# Patient Record
Sex: Female | Born: 2005 | Race: Black or African American | Hispanic: No | Marital: Single | Smoking: Never smoker
Health system: Southern US, Community
[De-identification: ages and names within clinical notes are randomized; demographics above are authoritative.]

## PROBLEM LIST (undated history)

## (undated) DIAGNOSIS — J45909 Unspecified asthma, uncomplicated: Secondary | ICD-10-CM

## (undated) DIAGNOSIS — R011 Cardiac murmur, unspecified: Secondary | ICD-10-CM

---
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
## 2024-03-28 IMAGING — US US PELVIS COMPLETE
1 series · 13 of 25 positions shown · non-contrast
Comparison: None Available.

CLINICAL DATA: llq abdominal pain

EXAM:
TRANSABDOMINAL ULTRASOUND OF PELVIS
DOPPLER ULTRASOUND OF OVARIES
TECHNIQUE: Transabdominal ultrasound examination of the pelvis was performed
including evaluation of the uterus, ovaries, adnexal regions, and
pelvic cul-de-sac.
Color and duplex Doppler ultrasound was utilized to evaluate blood
flow to the ovaries.

[Series 1: us pelvis (transabdominal only) · 55 acquisitions, 13 frames shown]
[im 1/55]
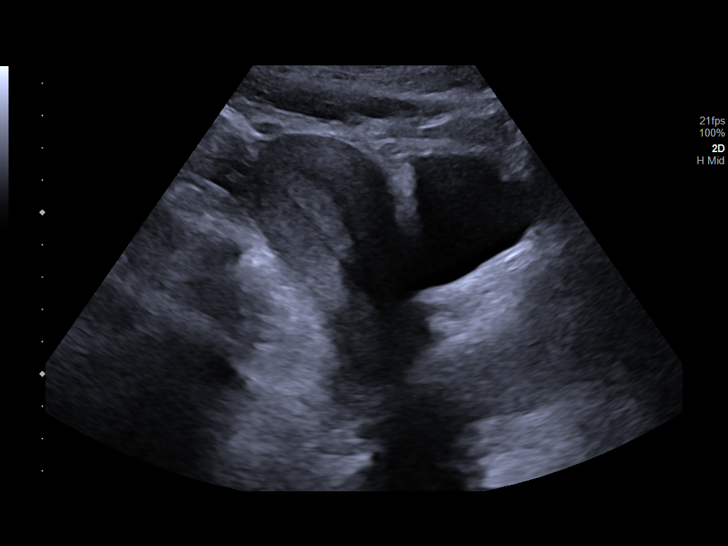
[im 5/55]
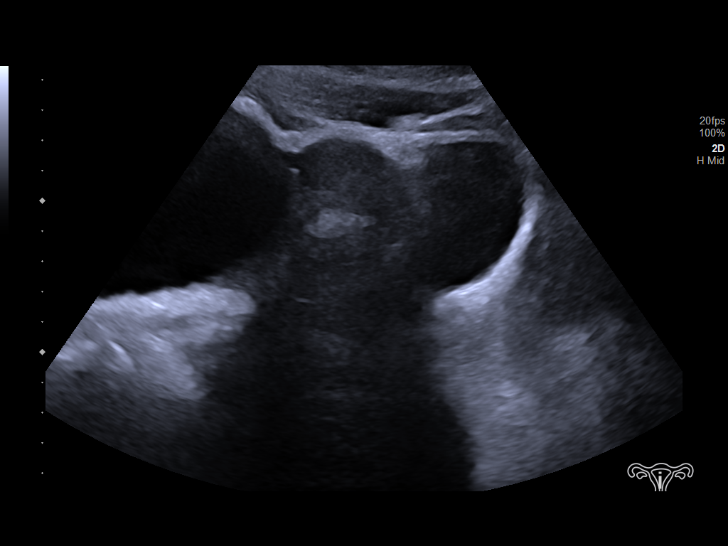
[im 10/55]
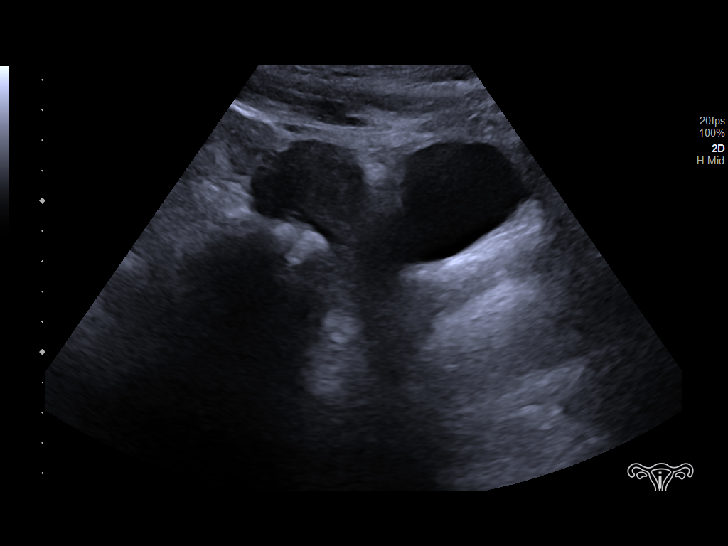
[im 14/55]
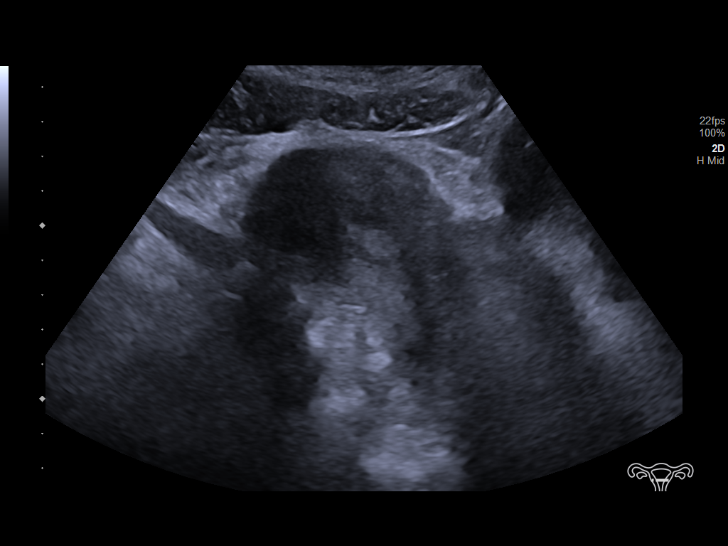
[im 19/55]
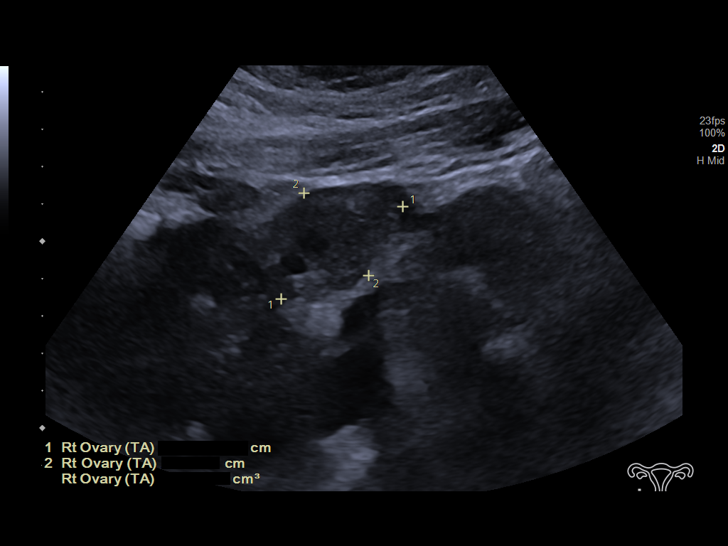
[im 23/55]
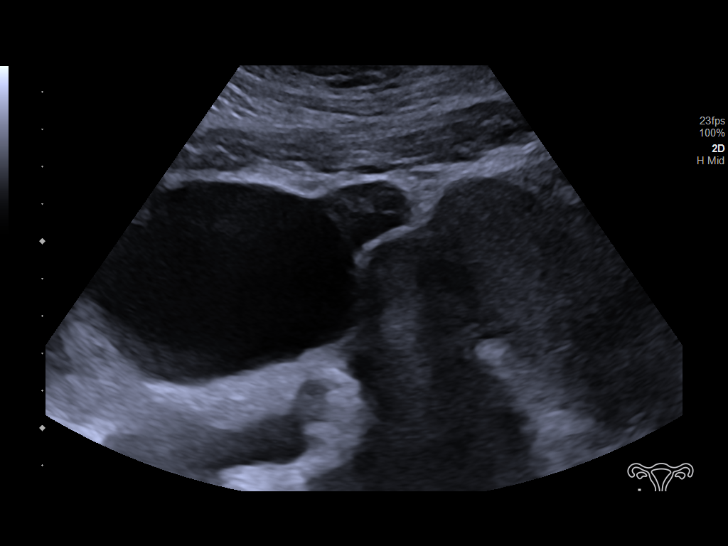
[im 28/55]
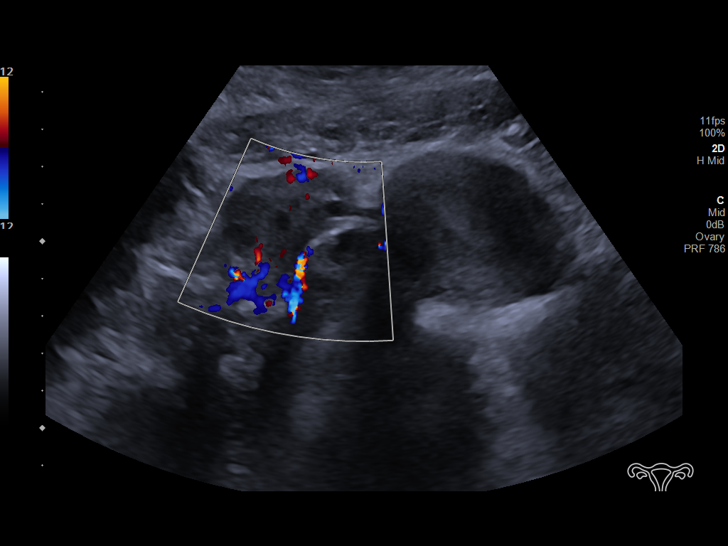
[im 32/55]
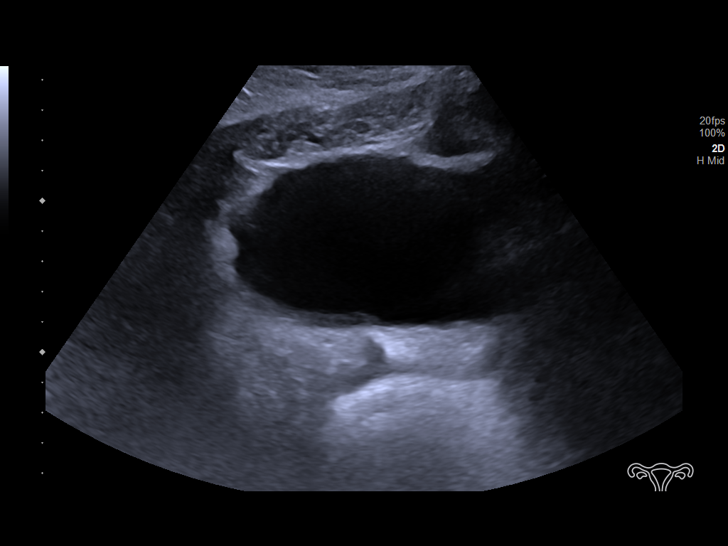
[im 37/55]
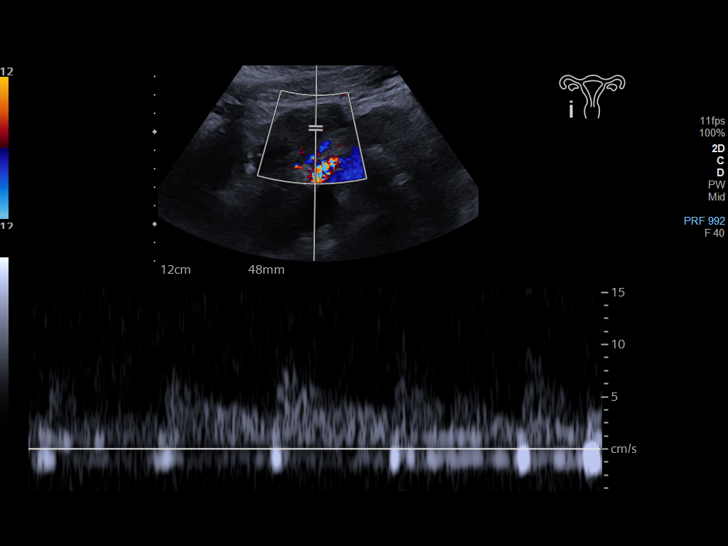
[im 41/55]
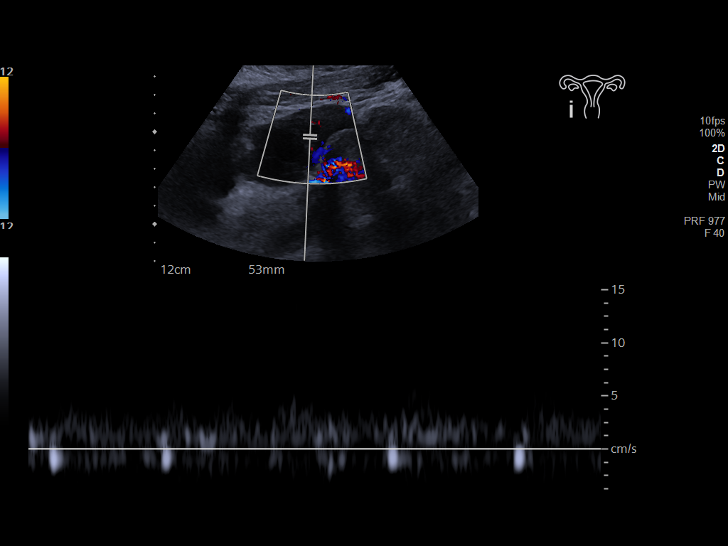
[im 46/55]
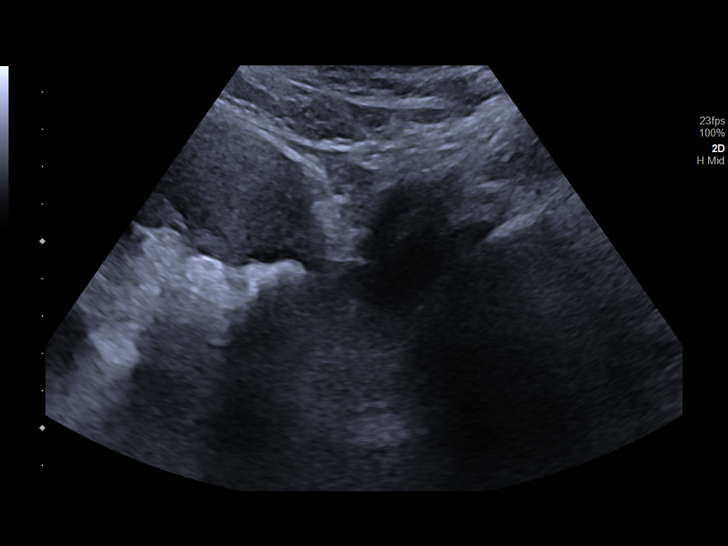
[im 50/55]
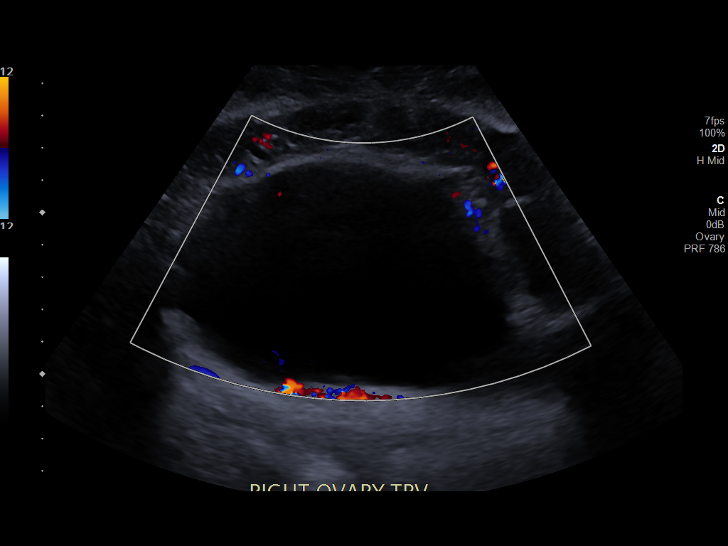
[im 55/55]
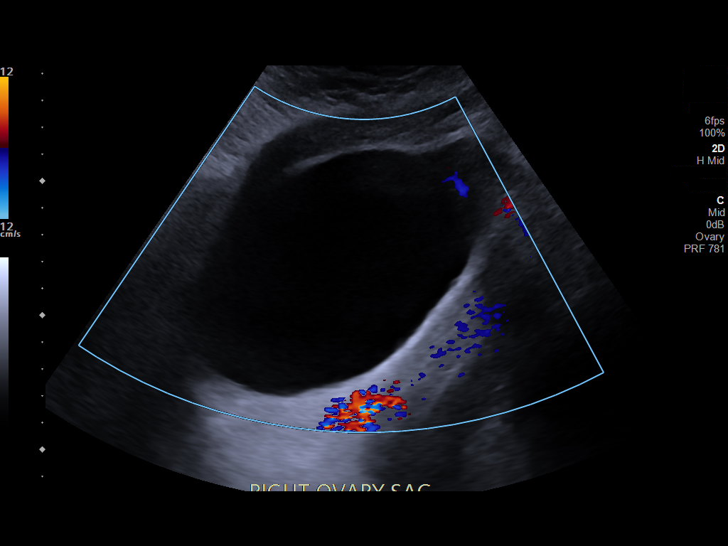

[13 of 25 positions shown; findings below may reference images not displayed]

FINDINGS: Uterus

Measurements: 8.4 x 3.8 x 5.3 cm = volume: 89 mL. No fibroids or
other mass visualized.

Endometrium

Thickness: 7.9 mm.  No focal abnormality visualized.

Right ovary

Measurements: 4.1 x 2.8 x 3.5 cm = volume: 20.8 mL. Normal
appearance/no adnexal mass.

There is large cystic structure subjacent to the right ovary
measuring 11.3 x 12.4 x 7 cm. No solid components seen in the cystic
structure.

Left ovary

Left ovary is not visualized.

Pulsed Doppler evaluation demonstrates normal low-resistance
arterial and venous waveforms in both ovaries.

Other: N/a
IMPRESSION: Uterus and right ovary have a normal appearance. Left ovary is not
visualized. Large cystic structure subjacent to the right ovary
measuring 11.3 x 12.4 x 7 cm.

12.4 cm right ovarian cyst, concern for low-grade cystic
neoplasm.Gynecology consult recommended and consider MR with IV
[DATE]):[phone]

## 2024-04-22 ENCOUNTER — Emergency Department (HOSPITAL_BASED_OUTPATIENT_CLINIC_OR_DEPARTMENT_OTHER)
Admission: EM | Admit: 2024-04-22 | Discharge: 2024-04-22 | Disposition: A | Discharge status: Home / Self Care | Payer: MEDICAID | Attending: Emergency Medicine | Admitting: Emergency Medicine

## 2024-04-22 ENCOUNTER — Other Ambulatory Visit: Payer: Self-pay

## 2024-04-22 ENCOUNTER — Emergency Department (HOSPITAL_BASED_OUTPATIENT_CLINIC_OR_DEPARTMENT_OTHER): Payer: MEDICAID

## 2024-04-22 DIAGNOSIS — W500XXA Accidental hit or strike by another person, initial encounter: Secondary | ICD-10-CM | POA: Diagnosis not present

## 2024-04-22 DIAGNOSIS — Y9372 Activity, wrestling: Secondary | ICD-10-CM | POA: Diagnosis not present

## 2024-04-22 DIAGNOSIS — S0592XA Unspecified injury of left eye and orbit, initial encounter: Secondary | ICD-10-CM | POA: Diagnosis present

## 2024-04-22 LAB — PREGNANCY, URINE: Preg Test, Ur: NEGATIVE

## 2024-04-22 MED ORDER — ONDANSETRON 4 MG PO TBDP
4.0000 mg | ORAL_TABLET | Freq: Once | ORAL | Status: AC
  Administered 2024-04-22: 4 mg via ORAL
  Filled 2024-04-22: qty 1

## 2024-04-22 MED ORDER — TETRACAINE HCL 0.5 % OP SOLN
1.0000 [drp] | Freq: Once | OPHTHALMIC | Status: AC
  Administered 2024-04-22: 1 [drp] via OPHTHALMIC
  Filled 2024-04-22: qty 4

## 2024-04-22 MED ORDER — KETOROLAC TROMETHAMINE 15 MG/ML IJ SOLN
15.0000 mg | Freq: Once | INTRAMUSCULAR | Status: AC
  Administered 2024-04-22: 15 mg via INTRAMUSCULAR
  Filled 2024-04-22: qty 1

## 2024-04-22 MED ORDER — FLUORESCEIN SODIUM 1 MG OP STRP
1.0000 | ORAL_STRIP | Freq: Once | OPHTHALMIC | Status: AC
  Administered 2024-04-22: 1 via OPHTHALMIC
  Filled 2024-04-22: qty 1

## 2024-04-22 NOTE — Discharge Instructions (Addendum)
 You were seen today for injury to your left eye.  Your imaging and physical exam today were reassuring that I have low suspicion for any emergent cause of your symptoms today.  With your vision improving and no other signs of injury at this time, we will have you manage pain with Tylenol  and ibuprofen  at home as well as following up with Dr. Grissom, ophthalmology as needed if you have any persistent blurry vision.  Take Tylenol  (acetominophen)  650mg  every 4-6 hours, as needed for pain or fever. Do not take more than 4,000 mg in a 24-hour period. As this may cause liver damage. While this is rare, if you begin to develop yellowing of the skin or eyes, stop taking and return to ER immediately.  Please return to the ED for any new or worsening symptoms

## 2024-04-22 NOTE — ED Provider Notes (Signed)
 [place] [facility] Provider Note   CSN: [id number] Arrival date & time: 04/22/24  1538     History  Chief Complaint  Patient presents with   Dizziness    Lisa Marks is a 18 y.o. female.   Dizziness  Patient is an 18 year old female presents ED today with left eye injury after she was reportedly wrestling with her brother was hit in her left orbit with an elbow.  States that since then she has felt nauseous, has had a headache, and has had blurry vision in the left eye. Endorses photophobia in the left eye.  Denies floaters, visual flashes, vertigo, chest pain, shortness of breath, neck pain, vomiting, abdominal pain.     Home Medications Prior to Admission medications   Medication Sig Start Date End Date Taking? Authorizing Provider  albuterol  (PROVENTIL  HFA;VENTOLIN  HFA) 108 (90 BASE) MCG/ACT inhaler Inhale 1-2 puffs into the lungs every 6 (six) hours as needed for wheezing or shortness of breath.    [provider]  beclomethasone (QVAR) 40 MCG/ACT inhaler Inhale 2 puffs into the lungs 2 (two) times daily.    [provider]  benzonatate  (TESSALON ) 100 MG capsule Take 1 capsule (100 mg total) by mouth every 8 (eight) hours. 12/01/23   Imogene Mana, NP  Ferrous Sulfate (IRON PO) Take by mouth.    [provider]  norethindrone -ethinyl estradiol (LOESTRIN 1/20, 21,) 1-20 MG-MCG tablet Take 1 tablet by mouth daily. 04/08/22   Garen Juneau, NP  ondansetron  (ZOFRAN -ODT) 4 MG disintegrating tablet Take 1 tablet (4 mg total) by mouth every 8 (eight) hours as needed. 04/08/22   Garen Juneau, NP  polyethylene glycol powder (GLYCOLAX /MIRALAX ) 17 GM/SCOOP powder Take 17 g by mouth daily. 04/08/22   Garen Juneau, NP  promethazine -dextromethorphan (PROMETHAZINE -DM) 6.25-15 MG/5ML syrup Take 5 mLs by mouth 4 (four) times daily as needed for cough. 12/01/23   Imogene Mana, NP  cetirizine  (ZYRTEC ) 1 MG/ML syrup Take 10 mLs  (10 mg total) by mouth daily. 03/27/15 05/19/19  Dalene Duck, MD  montelukast (SINGULAIR) 4 MG chewable tablet Chew 4 mg by mouth every morning.  05/19/19  [provider]      Allergies    Pork-derived products and Shellfish allergy    Review of Systems   Review of Systems  Eyes:  Positive for photophobia and pain.  Neurological:  Positive for dizziness.  All other systems reviewed and are negative.   Physical Exam Updated Vital Signs BP (!) 141/95 (BP Location: Left Arm)   Pulse 86   Temp 98.7 F (37.1 C) (Oral)   Resp 19   Ht 5\' 3"  (1.6 m)   SpO2 99%   BMI 43.22 kg/m  Physical Exam Vitals and nursing note reviewed.  Constitutional:      General: She is not in acute distress.    Appearance: Normal appearance. She is not ill-appearing or diaphoretic.  HENT:     Head: Normocephalic.     Comments: Patient notably tender over the left aspect of her forehead.  No swelling, erythema, bruising noted. Eyes:     General: Lids are normal. Lids are everted, no foreign bodies appreciated. Vision grossly intact. Gaze aligned appropriately. No scleral icterus.       Right eye: No discharge.        Left eye: No discharge.     Intraocular pressure: Left eye pressure is 18 mmHg.     Extraocular Movements: Extraocular movements intact.  Right eye: No nystagmus.     Left eye: No nystagmus.     Conjunctiva/sclera: Conjunctivae normal.     Right eye: Right conjunctiva is not injected. No chemosis, exudate or hemorrhage.    Left eye: Left conjunctiva is not injected. No chemosis, exudate or hemorrhage.    Pupils: Pupils are equal, round, and reactive to light.     Right eye: Pupil is round, reactive and not sluggish. No corneal abrasion.     Left eye: Pupil is round, reactive and not sluggish. No corneal abrasion or fluorescein uptake. Seidel exam negative.    Comments: Patient has no blurry vision in left eye, denies diplopia.  Patient notably has pain with medial EOM in  left eye but all EOMs intact.  Cardiovascular:     Rate and Rhythm: Normal rate and regular rhythm.     Pulses: Normal pulses.     Heart sounds: Normal heart sounds. No murmur heard.    No friction rub. No gallop.  Pulmonary:     Effort: Pulmonary effort is normal. No respiratory distress.     Breath sounds: Normal breath sounds.  Chest:     Chest wall: No tenderness.  Abdominal:     General: Abdomen is flat. There is no distension.     Palpations: Abdomen is soft.     Tenderness: There is no abdominal tenderness. There is no guarding.  Musculoskeletal:     Cervical back: Normal range of motion and neck supple. No rigidity or tenderness.  Skin:    General: Skin is warm and dry.     Findings: No bruising, erythema or lesion.  Neurological:     General: No focal deficit present.     Mental Status: She is alert and oriented to person, place, and time. Mental status is at baseline.     Sensory: No sensory deficit.     Motor: No weakness.     Gait: Gait normal.  Psychiatric:        Mood and Affect: Mood normal.     ED Results / Procedures / Treatments   Labs (all labs ordered are listed, but only abnormal results are displayed) Labs Reviewed  PREGNANCY, URINE    EKG EKG Interpretation Date/Time:  Friday April 22 2024 15:46:24 EDT Ventricular Rate:  86 PR Interval:  165 QRS Duration:  80 QT Interval:  368 QTC Calculation: 441 R Axis:   -5  Text Interpretation: Sinus rhythm since last tracing no significant change Confirmed by Hershel Los [phone]) on 04/22/2024 3:50:57 PM  Radiology CT Orbits Wo Contrast Result Date: 04/22/2024 CLINICAL DATA:  Left eye injury, pain EXAM: CT ORBITS WITHOUT CONTRAST TECHNIQUE: Multidetector CT imaging of the orbits was performed using the standard protocol without intravenous contrast. Multiplanar CT image reconstructions were also generated. RADIATION DOSE REDUCTION: This exam was performed according to the departmental dose-optimization  program which includes automated exposure control, adjustment of the mA and/or kV according to patient size and/or use of iterative reconstruction technique. COMPARISON:  None Available. FINDINGS: Orbits:  No fracture or orbital emphysema.  Globes are intact. Visible paranasal sinuses: Clear Soft tissues: Soft tissue swelling over the left orbit Osseous: No fracture or aggressive lesion. Limited intracranial: No acute or significant finding. IMPRESSION: No visible orbital or facial fracture. Electronically Signed   By: Janeece Mechanic M.D.   On: 04/22/2024 17:17    Procedures Procedures    Medications Ordered in ED Medications  fluorescein ophthalmic strip 1 strip (1 strip Left Eye  Given 04/22/24 1633)  ondansetron  (ZOFRAN -ODT) disintegrating tablet 4 mg (4 mg Oral Given 04/22/24 1633)  tetracaine (PONTOCAINE) 0.5 % ophthalmic solution 1 drop (1 drop Left Eye Given by Other 04/22/24 1819)  ketorolac (TORADOL) 15 MG/ML injection 15 mg (15 mg Intramuscular Given 04/22/24 1820)    ED Course/ Medical Decision Making/ A&P                                 Medical Decision Making Amount and/or Complexity of Data Reviewed Labs: ordered. Radiology: ordered.  Risk Prescription drug management.   This patient is a 18 year old female who presents to the ED for concern of left eye pain, photophobia, blurry vision, headache, nausea post getting hit with an elbow to left orbit today.   On physical exam, patient is in no acute distress, afebrile, alert and orient x 4, speaking in full sentences, nontachypneic, nontachycardic.  No foreign bodies noted, no erythema, injection, chemosis noted, EOMs intact, pupils PERRL with no signs of traumatic hyphema.  Does note pain with EOMs.  Negative Seidel sign, no corneal abrasion noted on fluorescein, no hyphema noted, visual acuity is 20/25 bilaterally and in each eye separately.  Pressures 18 in left eye.  Prior Toradol, patient states that her symptoms have improved,  notes that her vision is also returning.  Alerted her that we can call ophthalmology to consult them if she is willing to wait, she says that she is going to go home at this point and will follow ophthalmology as needed.  Will have her continue to manage pain with Tylenol  at home.  And return to the ED for any new or worsening symptoms.  CT scan was also negative for fracture.  Will have patient follow-up with ophthalmology as needed per her request if she begins to have any persistent or worsening vision.  Patient vital signs have remained stable throughout the course of patient's time in the ED. Low suspicion for any other emergent pathology at this time. I believe this patient is safe to be discharged. Provided strict return to ER precautions. Patient expressed agreement and understanding of plan. All questions were answered.  Differential diagnoses prior to evaluation: The emergent differential diagnosis includes, but is not limited to, globe rupture, globe fracture, orbital fracture, corneal abrasion, traumatic iritis, hyphema, periorbital contusion, subconjunctival hemorrhage, retinal detachment, intracranial hemorrhage. This is not an exhaustive differential.   Past Medical History / Co-morbidities / Social History: Asthma  Additional history: Chart reviewed.   Lab Tests/Imaging studies: I personally interpreted labs/imaging and the pertinent results include:   CT scan unremarkable Pregnancy is negative. I agree with the radiologist interpretation.  Cardiac monitoring: EKG obtained and interpreted by myself and attending physician which shows: Sinus rhythm   EKG Interpretation Date/Time:  Friday April 22 2024 15:46:24 EDT Ventricular Rate:  86 PR Interval:  165 QRS Duration:  80 QT Interval:  368 QTC Calculation: 441 R Axis:   -5  Text Interpretation: Sinus rhythm since last tracing no significant change Confirmed by Hershel Los [phone]) on 04/22/2024 3:50:57 PM           Medications: I ordered medication including Toradol.  I have reviewed the patients home medicines and have made adjustments as needed.  Social Determinants of Health: None  Disposition: After consideration of the diagnostic results and the patients response to treatment, I feel that the patient would benefit from discharge and treatment as above.  emergency department workup does not suggest an emergent condition requiring admission or immediate intervention beyond what has been performed at this time. The plan is: Follow-up with ophthalmology as needed for any returning or blurry vision, return to the ED for new or worsening symptoms, symptomatic management at home. The patient is safe for discharge and has been instructed to return immediately for worsening symptoms, change in symptoms or any other concerns.   Final Clinical Impression(s) / ED Diagnoses Final diagnoses:  Left eye injury, initial encounter    Rx / DC Orders ED Discharge Orders     None         Hayes Lipps, PA-C 04/22/24 1829    Hershel Los, MD 04/22/24 2039

## 2024-04-22 NOTE — ED Notes (Signed)
 Patient requesting warm blanket, blanket give.

## 2024-04-22 NOTE — ED Notes (Signed)
 Visual acuity completed per order. Pt does not wear glasses but says "I need to due to astigmatism in both eyes. Patient states vision is blurry in left eye.

## 2024-04-22 NOTE — ED Triage Notes (Signed)
 Pt was playing around with friend when friend accidentally hit pt in/around her her left eye with elbow. Pt states she was hit really hard. Pt complaining of dizziness and being tired. Endorses slight blurry vision in left eye.

## 2024-04-25 ENCOUNTER — Emergency Department (HOSPITAL_COMMUNITY)
Admission: EM | Admit: 2024-04-25 | Discharge: 2024-04-25 | Disposition: A | Discharge status: Home / Self Care | Payer: MEDICAID | Attending: Emergency Medicine | Admitting: Emergency Medicine

## 2024-04-25 ENCOUNTER — Emergency Department (HOSPITAL_COMMUNITY): Payer: MEDICAID

## 2024-04-25 DIAGNOSIS — X501XXA Overexertion from prolonged static or awkward postures, initial encounter: Secondary | ICD-10-CM | POA: Insufficient documentation

## 2024-04-25 DIAGNOSIS — Y9301 Activity, walking, marching and hiking: Secondary | ICD-10-CM | POA: Insufficient documentation

## 2024-04-25 DIAGNOSIS — S93401A Sprain of unspecified ligament of right ankle, initial encounter: Secondary | ICD-10-CM

## 2024-04-25 DIAGNOSIS — S99911A Unspecified injury of right ankle, initial encounter: Secondary | ICD-10-CM | POA: Diagnosis present

## 2024-04-25 DIAGNOSIS — S93491A Sprain of other ligament of right ankle, initial encounter: Secondary | ICD-10-CM | POA: Diagnosis not present

## 2024-04-25 MED ORDER — NAPROXEN 375 MG PO TABS: 375.0000 mg | ORAL_TABLET | Freq: Two times a day (BID) | ORAL | 0 refills | Status: AC

## 2024-04-25 NOTE — ED Provider Notes (Signed)
 [facility] EMERGENCY DEPARTMENT AT [facility] Provider Note   CSN: [id number] Arrival date & time: 04/25/24  1539     History  No chief complaint on file.   Lisa Marks is a 18 y.o. female  S: Lisa Marks is a 18 y.o. female who complains of inversion injury to the right ankle 2 days ago. There is pain and swelling at the lateral aspect of that ankle. The patient has been able to bear weight with significant pain. No umbness tingling or hx of previous injury   HPI     Home Medications Prior to Admission medications   Medication Sig Start Date End Date Taking? Authorizing Provider  naproxen  (NAPROSYN ) 375 MG tablet Take 1 tablet (375 mg total) by mouth 2 (two) times daily with a meal. 04/25/24  Yes Lisa Larocca, PA-C  albuterol  (PROVENTIL  HFA;VENTOLIN  HFA) 108 (90 BASE) MCG/ACT inhaler Inhale 1-2 puffs into the lungs every 6 (six) hours as needed for wheezing or shortness of breath.    [provider]  beclomethasone (QVAR) 40 MCG/ACT inhaler Inhale 2 puffs into the lungs 2 (two) times daily.    [provider]  benzonatate  (TESSALON ) 100 MG capsule Take 1 capsule (100 mg total) by mouth every 8 (eight) hours. 12/01/23   Lisa Mana, NP  Ferrous Sulfate (IRON PO) Take by mouth.    [provider]  norethindrone -ethinyl estradiol (LOESTRIN 1/20, 21,) 1-20 MG-MCG tablet Take 1 tablet by mouth daily. 04/08/22   Lisa Juneau, NP  ondansetron  (ZOFRAN -ODT) 4 MG disintegrating tablet Take 1 tablet (4 mg total) by mouth every 8 (eight) hours as needed. 04/08/22   Lisa Juneau, NP  polyethylene glycol powder (GLYCOLAX /MIRALAX ) 17 GM/SCOOP powder Take 17 g by mouth daily. 04/08/22   Lisa Juneau, NP  promethazine -dextromethorphan (PROMETHAZINE -DM) 6.25-15 MG/5ML syrup Take 5 mLs by mouth 4 (four) times daily as needed for cough. 12/01/23   Lisa Mana, NP  cetirizine  (ZYRTEC ) 1 MG/ML syrup Take 10 mLs (10 mg total) by mouth daily. 03/27/15 05/19/19   Lisa Duck, MD  montelukast (SINGULAIR) 4 MG chewable tablet Chew 4 mg by mouth every morning.  05/19/19  [provider]      Allergies    Pork-derived products and Shellfish allergy    Review of Systems   Review of Systems  Physical Exam Updated Vital Signs BP (!) 133/97 (BP Location: Left Arm)   Pulse (!) 107   Temp 98.1 F (36.7 C) (Oral)   Resp 18   Ht 5' 3 (1.6 m)   Wt 117.9 kg   SpO2 100%   BMI 46.06 kg/m  Physical Exam Constitutional:      Appearance: She is obese.  HENT:     Head: Normocephalic and atraumatic.     Mouth/Throat:     Mouth: Mucous membranes are moist.  Eyes:     Pupils: Pupils are equal, round, and reactive to light.  Pulmonary:     Effort: Pulmonary effort is normal.     Breath sounds: Normal breath sounds.  Musculoskeletal:     Comments: Exam of the injured ankle reveals swelling and tenderness over the lateral malleolus. No tenderness over the medial aspect of the ankle. The fifth metatarsal is not tender. The ankle joint is intact without excessive opening on stressing. X-Ray shows fracture to be negative. The rest of the foot, ankle and leg exam is normal.      ED Results / Procedures / Treatments  Labs (all labs ordered are listed, but only abnormal results are displayed) Labs Reviewed - No data to display  EKG None  Radiology DG Ankle Complete Right Result Date: 04/25/2024 CLINICAL DATA:  Two day history of right ankle pain after inversion injury EXAM: RIGHT ANKLE - COMPLETE 3 VIEW COMPARISON:  None Available. FINDINGS: There are no findings of fracture or dislocation. Small joint effusion. There is no evidence of arthropathy or other focal bone abnormality. Ankle mortise is intact. Soft tissue swelling over the lateral malleolus. IMPRESSION: Soft tissue swelling over the lateral malleolus without evidence of fracture or dislocation. Small ankle joint effusion. Electronically Signed   By: Limin  Xu M.D.   On: 04/25/2024  16:48    Procedures Procedures    Medications Ordered in ED Medications - No data to display  ED Course/ Medical Decision Making/ A&P                                 Medical Decision Making Lisa Marks is a 18 y.o. female who presents to ED for R ankle pain after mechanical fall. RLE NVI. Exam c/w ankle sprain. X-ray negative for acute fracture or dislocation. ASO brace and crutches provided in ED. Home care instructions including RICE and NSAID's discussed. Follow up with ortho if symptoms not improving in 1 week. All questions answered.    Amount and/or Complexity of Data Reviewed Radiology: ordered and independent interpretation performed.    Details: I personally visualized and interpreted the images using our PACS system. Acute findings include:  none   Risk Prescription drug management.           Final Clinical Impression(s) / ED Diagnoses Final diagnoses:  Moderate ankle sprain, right, initial encounter    Rx / DC Orders ED Discharge Orders          Ordered    naproxen  (NAPROSYN ) 375 MG tablet  2 times daily with meals        04/25/24 1700              Lisa Fails, PA-C 04/25/24 1728    Lisa Currier, MD 04/29/24 2308

## 2024-04-25 NOTE — ED Triage Notes (Signed)
 Patient complains of right ankle pain x 2 days. States she was walking down incline and foot twisted and she heard a cracking sounds, pain has gotten worse. States it has been swollen. No deformity observed at triage, swelling to left ankle ankle, good pedal pulses. Rates pain 6/10, worse when ambulating.

## 2024-04-25 NOTE — Discharge Instructions (Signed)
 General instructions Take over-the-counter and prescription medicines only as told by your provider. Do not use any products that contain nicotine or tobacco. These products include cigarettes, chewing tobacco, and vaping devices, such as e-cigarettes. If you need help quitting, ask your provider. Rest your ankle. Do not use your ankle to support your body weight until your provider says that you can. Use crutches as told by your provider. Ask your provider when it is safe to drive if you have a brace or splint on your ankle. Contact a health care provider if: You have bruising or swelling that get worse all of a sudden. Your pain does not get better with medicine. Get help right away if: Your foot or toes become numb or blue. You have severe pain that gets worse.

## 2024-04-30 ENCOUNTER — Emergency Department (HOSPITAL_COMMUNITY): Payer: MEDICAID

## 2024-04-30 ENCOUNTER — Other Ambulatory Visit: Payer: Self-pay

## 2024-04-30 ENCOUNTER — Emergency Department (HOSPITAL_COMMUNITY)
Admission: EM | Admit: 2024-04-30 | Discharge: 2024-04-30 | Disposition: A | Discharge status: Home / Self Care | Payer: MEDICAID | Attending: Emergency Medicine | Admitting: Emergency Medicine

## 2024-04-30 ENCOUNTER — Encounter (HOSPITAL_COMMUNITY): Payer: Self-pay | Admitting: *Deleted

## 2024-04-30 DIAGNOSIS — S93402A Sprain of unspecified ligament of left ankle, initial encounter: Secondary | ICD-10-CM | POA: Diagnosis not present

## 2024-04-30 DIAGNOSIS — X501XXA Overexertion from prolonged static or awkward postures, initial encounter: Secondary | ICD-10-CM | POA: Insufficient documentation

## 2024-04-30 DIAGNOSIS — M25572 Pain in left ankle and joints of left foot: Secondary | ICD-10-CM | POA: Diagnosis present

## 2024-04-30 MED ORDER — ACETAMINOPHEN 325 MG PO TABS
650.0000 mg | ORAL_TABLET | Freq: Once | ORAL | Status: AC
  Administered 2024-04-30: 650 mg via ORAL
  Filled 2024-04-30: qty 2

## 2024-04-30 NOTE — Discharge Instructions (Signed)
 Recommend 1000 mg of Tylenol  every 6 hours as needed for pain.  Recommend 800 mg ibuprofen  every 8 hours as needed for pain.  Use walking boot and crutches for comfort.  Activity as tolerated.  Recommend ice and elevation of the foot when possible.

## 2024-04-30 NOTE — ED Notes (Signed)
 Ortho tech called for placement of ordered CAM boot. Ortho tech states will be on the way shortly.

## 2024-04-30 NOTE — ED Provider Notes (Signed)
  EMERGENCY DEPARTMENT AT [facility] Provider Note   CSN: [id number] Arrival date & time: 04/30/24  1856     Patient presents with: Ankle Pain   Lisa Marks is a 18 y.o. female.   Patient here with left ankle pain after twisting her ankle earlier today.  Denies any numbness weakness tingling.  She has had some swelling.  Pain mostly to the medial portion of her ankle.  No pain elsewhere.  Did not hit her head or lose consciousness.  The history is provided by the patient.       Prior to Admission medications   Medication Sig Start Date End Date Taking? Authorizing Provider  albuterol  (PROVENTIL  HFA;VENTOLIN  HFA) 108 (90 BASE) MCG/ACT inhaler Inhale 1-2 puffs into the lungs every 6 (six) hours as needed for wheezing or shortness of breath.    [provider]  beclomethasone (QVAR) 40 MCG/ACT inhaler Inhale 2 puffs into the lungs 2 (two) times daily.    [provider]  benzonatate  (TESSALON ) 100 MG capsule Take 1 capsule (100 mg total) by mouth every 8 (eight) hours. 12/01/23   Imogene Mana, NP  Ferrous Sulfate (IRON PO) Take by mouth.    [provider]  naproxen  (NAPROSYN ) 375 MG tablet Take 1 tablet (375 mg total) by mouth 2 (two) times daily with a meal. 04/25/24   Tama Fails, PA-C  norethindrone -ethinyl estradiol (LOESTRIN 1/20, 21,) 1-20 MG-MCG tablet Take 1 tablet by mouth daily. 04/08/22   Garen Juneau, NP  ondansetron  (ZOFRAN -ODT) 4 MG disintegrating tablet Take 1 tablet (4 mg total) by mouth every 8 (eight) hours as needed. 04/08/22   Garen Juneau, NP  polyethylene glycol powder (GLYCOLAX /MIRALAX ) 17 GM/SCOOP powder Take 17 g by mouth daily. 04/08/22   Garen Juneau, NP  promethazine -dextromethorphan (PROMETHAZINE -DM) 6.25-15 MG/5ML syrup Take 5 mLs by mouth 4 (four) times daily as needed for cough. 12/01/23   Imogene Mana, NP  cetirizine  (ZYRTEC ) 1 MG/ML syrup Take 10 mLs (10 mg total) by mouth daily. 03/27/15 05/19/19  Dalene Duck, MD  montelukast (SINGULAIR) 4 MG chewable tablet Chew 4 mg by mouth every morning.  05/19/19  [provider]    Allergies: Pork-derived products and Shellfish allergy    Review of Systems  Updated Vital Signs BP 125/63 (BP Location: Left Arm)   Pulse 82   Temp 99.3 F (37.4 C)   Resp 18   Ht 5' 3 (1.6 m)   Wt 117.9 kg   SpO2 96%   BMI 46.04 kg/m   Physical Exam Constitutional:      General: She is not in acute distress.    Appearance: She is not ill-appearing.   Cardiovascular:     Pulses: Normal pulses.     Heart sounds: Normal heart sounds.   Musculoskeletal:        General: Swelling and tenderness present. Normal range of motion.     Cervical back: Normal range of motion.     Comments: Tenderness to the left ankle with mild swelling no obvious deformity   Skin:    General: Skin is warm.   Neurological:     General: No focal deficit present.     Mental Status: She is alert.     Sensory: No sensory deficit.     Motor: No weakness.     (all labs ordered are listed, but only abnormal results are displayed) Labs Reviewed - No data to display  EKG: None  Radiology:  DG Ankle Complete Left Result Date: 04/30/2024 EXAM: 3 VIEW(S) XRAY OF THE LEFT ANKLE 04/30/2024 07:47:00 PM CLINICAL HISTORY: The patient twisted her left ankle earlier today. Twisted left ankle; lateral swelling. COMPARISON: None available. FINDINGS: BONES AND JOINTS: No acute fracture. No focal osseous lesion. No joint dislocation. SOFT TISSUES: The soft tissues are unremarkable. IMPRESSION: 1. No acute osseous abnormality. Electronically signed by: Zadie Herter MD 04/30/2024 07:52 PM EDT RP Workstation: [id number]     Procedures   Medications Ordered in the ED  acetaminophen  (TYLENOL ) tablet 650 mg (650 mg Oral Given 04/30/24 2049)                                    Medical Decision Making Risk OTC drugs.   Leary Provencal is here with left ankle pain after  twisting her ankle.  Normal vitals.  No fever.  Differential diagnosis fracture versus sprain.  X-ray showed no fracture or dislocation per my review interpretation.  She is neurovascular neuromuscular intact on exam.  Recommend Tylenol  ice ibuprofen  and rest.  Given walking boot for comfort.  She has crutches at home.  She has no pain elsewhere.  Talked about elevating her leg and icing as well.  Will have her follow-up with orthopedics.  Discharged in good condition.  Tylenol  and ice provided in the ED.  This chart was dictated using voice recognition software.  Despite best efforts to proofread,  errors can occur which can change the documentation meaning.      Final diagnoses:  Sprain of left ankle, unspecified ligament, initial encounter    ED Discharge Orders     None          Lowery Rue, DO 04/30/24 2140

## 2024-04-30 NOTE — ED Notes (Addendum)
 Ice pack placed on ankle. Pt verbally upset that pain medications have not been given at this time. Patient informed that pain medications would have to be put in by the doctor as an order (patient seen by EDP a few minutes before). Once said medications have been ordered, they will be given. Pt verbalized understanding.

## 2024-04-30 NOTE — ED Notes (Signed)
 Ortho tech at bedside

## 2024-04-30 NOTE — ED Notes (Signed)
 EDP provider at bedside educating patient on importance of use of crutches and keeping extremity elevated. Pt states she has crutches at home.

## 2024-04-30 NOTE — ED Notes (Signed)
 Ortho tech reports to this RN patient refusing crutches at this time. EDP made aware.

## 2024-04-30 NOTE — Progress Notes (Signed)
 Orthopedic Tech Progress Note Patient Details:  Lisa Marks 06-07-2006 [id number]  Ortho Devices Type of Ortho Device: CAM walker Ortho Device/Splint Location: lle Ortho Device/Splint Interventions: Ordered, Application, Adjustment  The pt refused crutches when I told them that they would need to get up and show me they could us  them properly. Post Interventions Patient Tolerated: Well Instructions Provided: Care of device, Adjustment of device  Terryann Fiddler 04/30/2024, 9:20 PM

## 2024-04-30 NOTE — ED Triage Notes (Signed)
 The pt is c/o lt ankle pain 1400 today she twisted the ankle and fell  lmp none bc

## 2024-05-15 ENCOUNTER — Other Ambulatory Visit: Payer: Self-pay

## 2024-05-15 ENCOUNTER — Encounter (HOSPITAL_COMMUNITY): Payer: Self-pay | Admitting: *Deleted

## 2024-05-15 ENCOUNTER — Emergency Department (HOSPITAL_COMMUNITY)
Admission: EM | Admit: 2024-05-15 | Discharge: 2024-05-16 | Disposition: A | Discharge status: Home / Self Care | Payer: MEDICAID | Attending: Emergency Medicine | Admitting: Emergency Medicine

## 2024-05-15 ENCOUNTER — Emergency Department (HOSPITAL_COMMUNITY): Payer: MEDICAID

## 2024-05-15 DIAGNOSIS — X501XXA Overexertion from prolonged static or awkward postures, initial encounter: Secondary | ICD-10-CM | POA: Diagnosis not present

## 2024-05-15 DIAGNOSIS — M25571 Pain in right ankle and joints of right foot: Secondary | ICD-10-CM | POA: Insufficient documentation

## 2024-05-15 NOTE — ED Triage Notes (Signed)
 Pt sprained her left ankle 2 weeks ago and placed in a cam walker. Reports ongoing pain and discomfort to ankle since injury. Swelling noted. Denies new injury

## 2024-05-16 MED ORDER — NAPROXEN 375 MG PO TABS: 375.0000 mg | ORAL_TABLET | Freq: Two times a day (BID) | ORAL | 0 refills | Status: AC

## 2024-05-16 MED ORDER — NAPROXEN 250 MG PO TABS
500.0000 mg | ORAL_TABLET | Freq: Once | ORAL | Status: AC
  Administered 2024-05-16: 500 mg via ORAL
  Filled 2024-05-16: qty 2

## 2024-05-16 NOTE — ED Provider Notes (Signed)
  EMERGENCY DEPARTMENT AT [facility] Provider Note   CSN: [id number] Arrival date & time: 05/15/24  8090     Patient presents with: Ankle Pain   Lisa Marks is a 18 y.o. female.   The history is provided by the patient.  Ankle Pain Location:  Ankle Time since incident:  3 weeks Injury: yes   Mechanism of injury comment:  Twisted Ankle location:  R ankle Pain details:    Quality:  Aching   Severity:  Moderate   Timing:  Constant   Progression:  Unchanged Chronicity:  New Relieved by:  Nothing Worsened by:  Nothing Ineffective treatments:  Acetaminophen  and NSAIDs Associated symptoms: no fever and no tingling   Risk factors: no concern for non-accidental trauma        Prior to Admission medications   Medication Sig Start Date End Date Taking? Authorizing Provider  naproxen  (NAPROSYN ) 375 MG tablet Take 1 tablet (375 mg total) by mouth 2 (two) times daily with a meal. 05/16/24  Yes Breyah Akhter, MD  albuterol  (PROVENTIL  HFA;VENTOLIN  HFA) 108 (90 BASE) MCG/ACT inhaler Inhale 1-2 puffs into the lungs every 6 (six) hours as needed for wheezing or shortness of breath.    [provider]  beclomethasone (QVAR) 40 MCG/ACT inhaler Inhale 2 puffs into the lungs 2 (two) times daily.    [provider]  benzonatate  (TESSALON ) 100 MG capsule Take 1 capsule (100 mg total) by mouth every 8 (eight) hours. 12/01/23   Remi Pippin, NP  Ferrous Sulfate (IRON PO) Take by mouth.    [provider]  naproxen  (NAPROSYN ) 375 MG tablet Take 1 tablet (375 mg total) by mouth 2 (two) times daily with a meal. 04/25/24   Arloa Chroman, PA-C  norethindrone -ethinyl estradiol (LOESTRIN 1/20, 21,) 1-20 MG-MCG tablet Take 1 tablet by mouth daily. 04/08/22   Erasmo Waddell SAUNDERS, NP  ondansetron  (ZOFRAN -ODT) 4 MG disintegrating tablet Take 1 tablet (4 mg total) by mouth every 8 (eight) hours as needed. 04/08/22   Erasmo Waddell SAUNDERS, NP  polyethylene glycol powder  (GLYCOLAX /MIRALAX ) 17 GM/SCOOP powder Take 17 g by mouth daily. 04/08/22   Erasmo Waddell SAUNDERS, NP  promethazine -dextromethorphan (PROMETHAZINE -DM) 6.25-15 MG/5ML syrup Take 5 mLs by mouth 4 (four) times daily as needed for cough. 12/01/23   Remi Pippin, NP  cetirizine  (ZYRTEC ) 1 MG/ML syrup Take 10 mLs (10 mg total) by mouth daily. 03/27/15 05/19/19  Patt Alm Macho, MD  montelukast (SINGULAIR) 4 MG chewable tablet Chew 4 mg by mouth every morning.  05/19/19  [provider]    Allergies: Pork-derived products and Shellfish allergy    Review of Systems  Constitutional:  Negative for fever.  Respiratory:  Negative for wheezing and stridor.   Musculoskeletal:  Positive for arthralgias.  All other systems reviewed and are negative.   Updated Vital Signs BP 135/88 (BP Location: Left Arm)   Pulse 72   Temp 97.8 F (36.6 C) (Oral)   Resp 18   SpO2 100%   Physical Exam Vitals and nursing note reviewed.  Constitutional:      General: She is not in acute distress.    Appearance: Normal appearance. She is well-developed.  HENT:     Head: Normocephalic and atraumatic.     Nose: Nose normal.   Eyes:     Pupils: Pupils are equal, round, and reactive to light.    Cardiovascular:     Rate and Rhythm: Normal rate and regular rhythm.  Pulses: Normal pulses.     Heart sounds: Normal heart sounds.  Pulmonary:     Effort: No respiratory distress.     Breath sounds: Normal breath sounds.  Abdominal:     General: Bowel sounds are normal. There is no distension.     Palpations: Abdomen is soft.     Tenderness: There is no abdominal tenderness. There is no guarding or rebound.   Musculoskeletal:        General: Normal range of motion.     Cervical back: Normal range of motion and neck supple.   Skin:    General: Skin is warm and dry.     Capillary Refill: Capillary refill takes less than 2 seconds.     Findings: No erythema or rash.   Neurological:     General: No focal  deficit present.     Mental Status: She is alert and oriented to person, place, and time.     Deep Tendon Reflexes: Reflexes normal.   Psychiatric:        Mood and Affect: Mood normal.        Behavior: Behavior normal.     (all labs ordered are listed, but only abnormal results are displayed) Labs Reviewed - No data to display  EKG: None  Radiology: DG Ankle Complete Left Result Date: 05/15/2024 CLINICAL DATA:  swelling , difficulty ambulating EXAM: LEFT ANKLE COMPLETE - 3+ VIEW COMPARISON:  April 30, 2024 FINDINGS: No acute fracture or dislocation. No ankle mortise widening. The talar dome is intact. There is no evidence of arthropathy or other focal bone abnormality. Moderate soft tissue swelling about the calf and ankle. IMPRESSION: Moderate soft tissue swelling about the calf and ankle. No acute fracture or dislocation. Electronically Signed   By: Rogelia Myers M.D.   On: 05/15/2024 20:47     Procedures   Medications Ordered in the ED  naproxen  (NAPROSYN ) tablet 500 mg (500 mg Oral Given 05/16/24 0403)                                    Medical Decision Making Ongoing ankle pain   Amount and/or Complexity of Data Reviewed External Data Reviewed: notes.    Details: Previous notes reviewed  Radiology: ordered and independent interpretation performed.    Details: Negative for fracture   Risk Prescription drug management. Risk Details: Patient with ongoing ankle pain post injury 3 weeks ago. Will have patient follow up with podiatry as an outpatient.  Stable for discharge.       Final diagnoses:  Right ankle pain, unspecified chronicity   No signs of systemic illness or infection. The patient is nontoxic-appearing on exam and vital signs are within normal limits.  I have reviewed the triage vital signs and the nursing notes. Pertinent labs & imaging results that were available during my care of the patient were reviewed by me and considered in my medical decision  making (see chart for details). After history, exam, and medical workup I feel the patient has been appropriately medically screened and is safe for discharge home. Pertinent diagnoses were discussed with the patient. Patient was given return precautions.  ED Discharge Orders          Ordered    naproxen  (NAPROSYN ) 375 MG tablet  2 times daily with meals        05/16/24 0422  Marabelle Cushman, MD 05/16/24 [phone]
